# Patient Record
Sex: Male | Born: 1960 | Race: White | Hispanic: No | Marital: Married | Smoking: Former smoker
Health system: Southern US, Community
[De-identification: ages and names within clinical notes are randomized; demographics above are authoritative.]

## PROBLEM LIST (undated history)

## (undated) HISTORY — PX: MANDIBLE SURGERY: SHX707

---

## 2016-04-07 ENCOUNTER — Emergency Department: Payer: 59

## 2016-04-07 ENCOUNTER — Emergency Department
Admission: EM | Admit: 2016-04-07 | Discharge: 2016-04-07 | Disposition: A | Discharge status: Home / Self Care | Payer: 59 | Attending: Emergency Medicine | Admitting: Emergency Medicine

## 2016-04-07 ENCOUNTER — Encounter: Payer: Self-pay | Admitting: Emergency Medicine

## 2016-04-07 DIAGNOSIS — Z87891 Personal history of nicotine dependence: Secondary | ICD-10-CM | POA: Insufficient documentation

## 2016-04-07 DIAGNOSIS — Y999 Unspecified external cause status: Secondary | ICD-10-CM | POA: Insufficient documentation

## 2016-04-07 DIAGNOSIS — Y9389 Activity, other specified: Secondary | ICD-10-CM | POA: Insufficient documentation

## 2016-04-07 DIAGNOSIS — M7918 Myalgia, other site: Secondary | ICD-10-CM

## 2016-04-07 DIAGNOSIS — S161XXA Strain of muscle, fascia and tendon at neck level, initial encounter: Secondary | ICD-10-CM | POA: Insufficient documentation

## 2016-04-07 DIAGNOSIS — Y92410 Unspecified street and highway as the place of occurrence of the external cause: Secondary | ICD-10-CM | POA: Insufficient documentation

## 2016-04-07 DIAGNOSIS — S199XXA Unspecified injury of neck, initial encounter: Secondary | ICD-10-CM | POA: Diagnosis present

## 2016-04-07 MED ORDER — CYCLOBENZAPRINE HCL 10 MG PO TABS
10.0000 mg | ORAL_TABLET | Freq: Once | ORAL | Status: AC
  Administered 2016-04-07: 10 mg via ORAL
  Filled 2016-04-07: qty 1

## 2016-04-07 MED ORDER — CYCLOBENZAPRINE HCL 10 MG PO TABS: 10.0000 mg | ORAL_TABLET | Freq: Three times a day (TID) | ORAL | 0 refills | Status: AC | PRN

## 2016-04-07 MED ORDER — IBUPROFEN 600 MG PO TABS: 600.0000 mg | ORAL_TABLET | Freq: Three times a day (TID) | ORAL | 0 refills | Status: AC | PRN

## 2016-04-07 MED ORDER — TRAMADOL HCL 50 MG PO TABS: 50.0000 mg | ORAL_TABLET | Freq: Four times a day (QID) | ORAL | 0 refills | Status: AC | PRN

## 2016-04-07 MED ORDER — IBUPROFEN 600 MG PO TABS
600.0000 mg | ORAL_TABLET | Freq: Once | ORAL | Status: AC
  Administered 2016-04-07: 600 mg via ORAL
  Filled 2016-04-07: qty 1

## 2016-04-07 MED ORDER — TRAMADOL HCL 50 MG PO TABS
50.0000 mg | ORAL_TABLET | Freq: Once | ORAL | Status: AC
  Administered 2016-04-07: 50 mg via ORAL
  Filled 2016-04-07: qty 1
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
